# Patient Record
Sex: Male | Born: 1970 | Race: White | Hispanic: No | Marital: Single | Smoking: Current every day smoker
Health system: Southern US, Community
[De-identification: ages and names within clinical notes are randomized; demographics above are authoritative.]

---

## 2018-10-29 ENCOUNTER — Other Ambulatory Visit: Payer: Self-pay

## 2018-10-29 ENCOUNTER — Emergency Department: Payer: Self-pay

## 2018-10-29 ENCOUNTER — Emergency Department
Admission: EM | Admit: 2018-10-29 | Discharge: 2018-10-29 | Disposition: A | Discharge status: Home / Self Care | Payer: Self-pay | Attending: Student in an Organized Health Care Education/Training Program | Admitting: Student in an Organized Health Care Education/Training Program

## 2018-10-29 ENCOUNTER — Encounter: Payer: Self-pay | Admitting: Emergency Medicine

## 2018-10-29 DIAGNOSIS — M79602 Pain in left arm: Secondary | ICD-10-CM | POA: Insufficient documentation

## 2018-10-29 DIAGNOSIS — F17210 Nicotine dependence, cigarettes, uncomplicated: Secondary | ICD-10-CM | POA: Insufficient documentation

## 2018-10-29 DIAGNOSIS — R079 Chest pain, unspecified: Secondary | ICD-10-CM | POA: Insufficient documentation

## 2018-10-29 LAB — CBC WITH DIFFERENTIAL/PLATELET
Abs Immature Granulocytes: 0.1 10*3/uL — ABNORMAL HIGH (ref 0.00–0.07)
Basophils Absolute: 0 10*3/uL (ref 0.0–0.1)
Basophils Relative: 0 %
Eosinophils Absolute: 0.3 10*3/uL (ref 0.0–0.5)
Eosinophils Relative: 3 %
HEMATOCRIT: 46.9 % (ref 39.0–52.0)
HEMOGLOBIN: 15.8 g/dL (ref 13.0–17.0)
Immature Granulocytes: 1 %
LYMPHS ABS: 3.4 10*3/uL (ref 0.7–4.0)
Lymphocytes Relative: 33 %
MCH: 31.7 pg (ref 26.0–34.0)
MCHC: 33.7 g/dL (ref 30.0–36.0)
MCV: 94.2 fL (ref 80.0–100.0)
MONOS PCT: 9 %
Monocytes Absolute: 0.9 10*3/uL (ref 0.1–1.0)
Neutro Abs: 5.6 10*3/uL (ref 1.7–7.7)
Neutrophils Relative %: 54 %
Platelets: 248 10*3/uL (ref 150–400)
RBC: 4.98 MIL/uL (ref 4.22–5.81)
RDW: 13.2 % (ref 11.5–15.5)
WBC: 10.4 10*3/uL (ref 4.0–10.5)
nRBC: 0 % (ref 0.0–0.2)

## 2018-10-29 LAB — COMPREHENSIVE METABOLIC PANEL
ALT: 21 U/L (ref 0–44)
AST: 24 U/L (ref 15–41)
Albumin: 3.9 g/dL (ref 3.5–5.0)
Alkaline Phosphatase: 100 U/L (ref 38–126)
Anion gap: 7 (ref 5–15)
BILIRUBIN TOTAL: 0.6 mg/dL (ref 0.3–1.2)
BUN: 11 mg/dL (ref 6–20)
CHLORIDE: 107 mmol/L (ref 98–111)
CO2: 25 mmol/L (ref 22–32)
Calcium: 8.8 mg/dL — ABNORMAL LOW (ref 8.9–10.3)
Creatinine, Ser: 1.18 mg/dL (ref 0.61–1.24)
GFR calc Af Amer: >60 mL/min (ref >60)
GFR calc non Af Amer: >60 mL/min (ref >60)
Glucose, Bld: 92 mg/dL (ref 70–99)
Potassium: 4.1 mmol/L (ref 3.5–5.1)
Sodium: 139 mmol/L (ref 135–145)
Total Protein: 7.2 g/dL (ref 6.5–8.1)

## 2018-10-29 LAB — TROPONIN I
Troponin I: <0.03 ng/mL (ref <0.03)
Troponin I: <0.03 ng/mL (ref <0.03)

## 2018-10-29 MED ORDER — MORPHINE SULFATE (PF) 4 MG/ML IV SOLN
4.0000 mg | INTRAVENOUS | Status: DC | PRN
  Administered 2018-10-29: 4 mg via INTRAVENOUS
  Filled 2018-10-29: qty 1

## 2018-10-29 MED ORDER — HYDROCODONE-ACETAMINOPHEN 5-325 MG PO TABS: 1.0000 | ORAL_TABLET | ORAL | 0 refills | Status: AC | PRN

## 2018-10-29 MED ORDER — KETOROLAC TROMETHAMINE 30 MG/ML IJ SOLN
15.0000 mg | Freq: Once | INTRAMUSCULAR | Status: AC
  Administered 2018-10-29: 15 mg via INTRAVENOUS
  Filled 2018-10-29: qty 1

## 2018-10-29 MED ORDER — IOHEXOL 350 MG/ML SOLN
75.0000 mL | Freq: Once | INTRAVENOUS | Status: AC | PRN
  Administered 2018-10-29: 75 mL via INTRAVENOUS

## 2018-10-29 MED ORDER — ONDANSETRON HCL 4 MG/2ML IJ SOLN
4.0000 mg | Freq: Once | INTRAMUSCULAR | Status: AC
  Administered 2018-10-29: 4 mg via INTRAVENOUS
  Filled 2018-10-29: qty 2

## 2018-10-29 NOTE — ED Notes (Signed)
1 knife given to [employer] for placement in safe.

## 2018-10-29 NOTE — Discharge Instructions (Signed)
His follow-up with PCP.  Return to the ER if you develop any worsening pain or for any questions or concerns.

## 2018-10-29 NOTE — ED Provider Notes (Signed)
[facility] Emergency Department Provider Note    First MD Initiated Contact with Patient 10/29/18 0845     (approximate)  I have reviewed the triage vital signs and the nursing notes.   HISTORY  Chief Complaint Arm Pain and Pleurisy    HPI John Boyer is a 48 y.o. male presents the ER with several days of progressively worsening left wrist and arm pain.  States he does have a history of carpal tunnel but states the pain is gotten so severe that is rating up into his shoulder and into his chest.  Does not have any pain when taking deep inspiration.  States it felt better when he was sleeping on his left side.  Denies any trauma.  Denies any recent fevers.  Denies any history of heart disease.    History reviewed. No pertinent past medical history. No family history on file. History reviewed. No pertinent surgical history. There are no active problems to display for this patient.     Prior to Admission medications   Not on File    Allergies Patient has no known allergies.    Social History Social History   Tobacco Use  . Smoking status: Current Every Day Smoker    Packs/day: 1.50  . Smokeless tobacco: Never Used  Substance Use Topics  . Alcohol use: Yes    Comment: "weekend"  . Drug use: Yes    Frequency: 7.0 times per week    Types: Marijuana    Review of Systems Patient denies headaches, rhinorrhea, blurry vision, numbness, shortness of breath, chest pain, edema, cough, abdominal pain, nausea, vomiting, diarrhea, dysuria, fevers, rashes or hallucinations unless otherwise stated above in HPI. ____________________________________________   PHYSICAL EXAM:  VITAL SIGNS: Vitals:   10/29/18 0835  BP: 124/73  Pulse: 92  Resp: 18  Temp: 97.6 F (36.4 C)  SpO2: 99%    Constitutional: Alert and oriented.  Eyes: Conjunctivae are normal.  Head: Atraumatic. Nose: No congestion/rhinnorhea. Mouth/Throat: Mucous membranes are  moist.   Neck: No stridor. Painless ROM.  Cardiovascular: Normal rate, regular rhythm. Grossly normal heart sounds.  Good peripheral circulation.  Strong radial pulses bilaterally brisk cap refill.  No bruits auscultated Respiratory: Normal respiratory effort.  No retractions. Lungs CTAB. Gastrointestinal: Soft and nontender. No distention. No abdominal bruits. No CVA tenderness. Genitourinary:  Musculoskeletal: Pain reproduced with Tinel's sign and Phalen sign of the left.  No lower extremity tenderness nor edema.  No joint effusions. Neurologic:  Normal speech and language. No gross focal neurologic deficits are appreciated. No facial droop Skin:  Skin is warm, dry and intact. No rash noted. Psychiatric: Mood and affect are normal. Speech and behavior are normal.  ____________________________________________   LABS (all labs ordered are listed, but only abnormal results are displayed)  No results found for this or any previous visit (from the past 24 hour(s)). ____________________________________________  EKG My review and personal interpretation at Time: 8:31   Indication: shoulder pain  Rate: 90  Rhythm: sinus Axis: normal Other: normal intervals, no stemi ____________________________________________  RADIOLOGY  I personally reviewed all radiographic images ordered to evaluate for the above acute complaints and reviewed radiology reports and findings.  These findings were personally discussed with the patient.  Please see medical record for radiology report.  ____________________________________________   PROCEDURES  Procedure(s) performed:  Procedures    Critical Care performed: no ____________________________________________   INITIAL IMPRESSION / ASSESSMENT AND PLAN / ED COURSE  Pertinent labs & imaging results that  were available during my care of the patient were reviewed by me and considered in my medical decision making (see chart for details).   DDX: ACS,  pericarditis, esophagitis, boerhaaves, pe, dissection, pna, bronchitis, costochondritis   John Boyer is a 48 y.o. who presents to the ED with above symptoms presents the ER.  He is afebrile and hemodynamically stable.  He initial EKG shows no evidence of acute ischemia.  However given his age and presenting symptoms will further stratify with troponins.  Does not seem clinically consistent with dissection.  Patient is low risk by Wells criteria and is PERC negative.  Exam seems primarily musculoskeletal possible severe carpal tunnel.  No radicular symptoms.  Clinical Course as of Oct 29 1440  Wed Oct 29, 2018  1346 Repeat troponin negative.  CT angiogram is reassuring.   [PR]  1422 Patient's pain improved.  He is in no acute discomfort at this time.  He stable and appropriate for outpatient follow-up.   [PR]    Clinical Course User Index [PR] Willy Eddy, MD     As part of my medical decision making, I reviewed the following data within the electronic MEDICAL RECORD NUMBER Nursing notes reviewed and incorporated, Labs reviewed, notes from prior ED visits and [place] Controlled Substance Database   ____________________________________________   FINAL CLINICAL IMPRESSION(S) / ED DIAGNOSES  Final diagnoses:  Left arm pain      NEW MEDICATIONS STARTED DURING THIS VISIT:  New Prescriptions   No medications on file     Note:  This document was prepared using Dragon voice recognition software and may include unintentional dictation errors.    Willy Eddy, MD 10/29/18 [phone]

## 2018-10-29 NOTE — ED Triage Notes (Signed)
Pt presents to ED via POV with c/o L chest and L arm pain x 2 days. Pt states weakness to L arm, pain worse with movement, states pain radiates into L chest. Pt appears uncomfortable in triage.

## 2018-10-29 NOTE — ED Notes (Signed)
First Nurse Note: Patient states he thinks he has a "pinched nerve" no relief with ibuprofen.  Complaining of pain in left arm with numbness and tingling.

## 2019-12-10 IMAGING — CT CT ANGIO CHEST
3 of 6 series · 18 of 46 positions shown · IV contrast (APPLIED)
Comparison: Chest radiograph October 29, 2018

CLINICAL DATA: Chest pain

EXAM:
CT ANGIOGRAPHY CHEST WITH CONTRAST
TECHNIQUE: Multidetector CT imaging of the chest was performed using the
standard protocol during bolus administration of intravenous
contrast. Multiplanar CT image reconstructions and MIPs were
obtained to evaluate the vascular anatomy.
CONTRAST:  75mL OMNIPAQUE IOHEXOL 350 MG/ML SOLN

[Series 4: axial arterial · axial · arterial · 0.70mm/px · z∈[-231,+39]mm · 11 of 108 slices shown]
[im 9/108  lung]
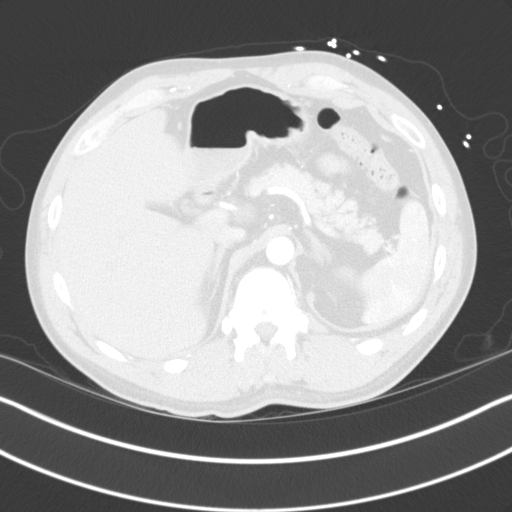
[im 18/108  soft-tissue]
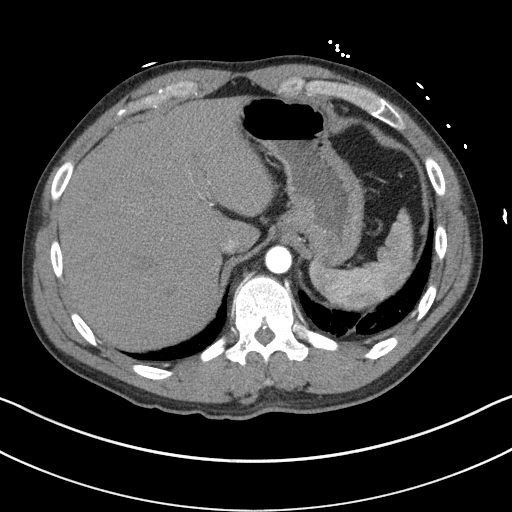
[im 27/108  lung]
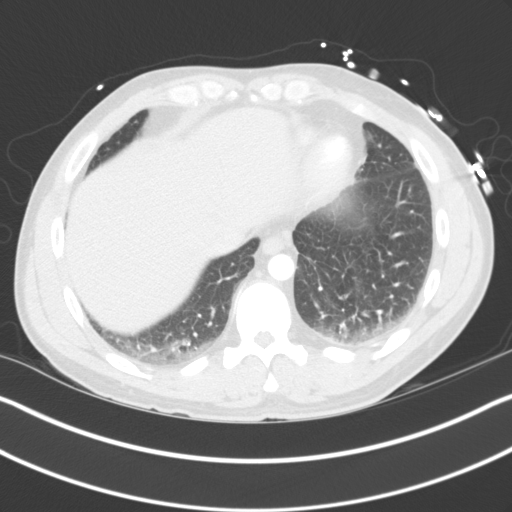
[im 36/108  soft-tissue]
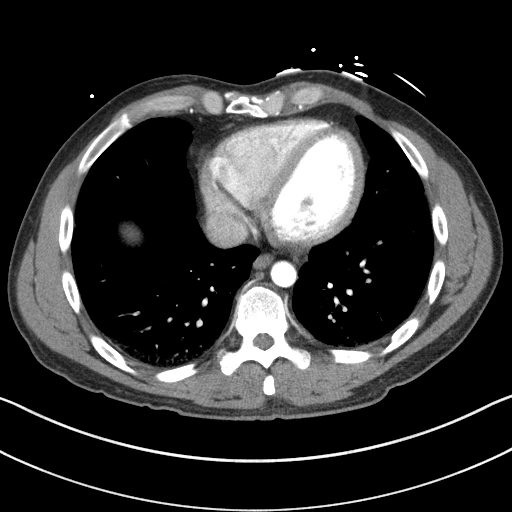
[im 45/108  lung]
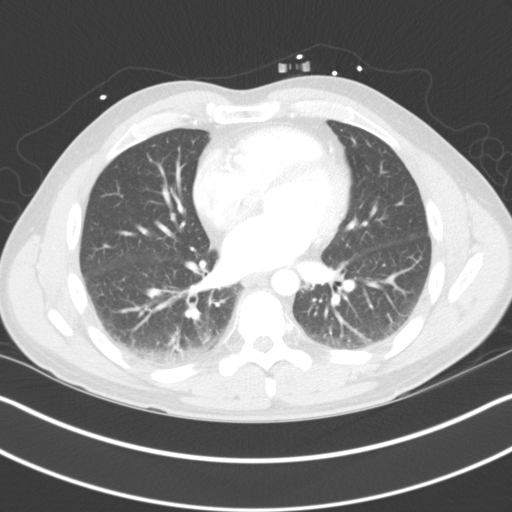
[im 54/108  soft-tissue]
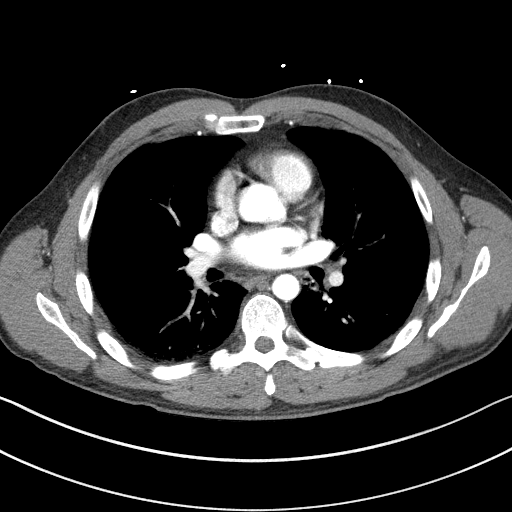
[im 63/108  lung]
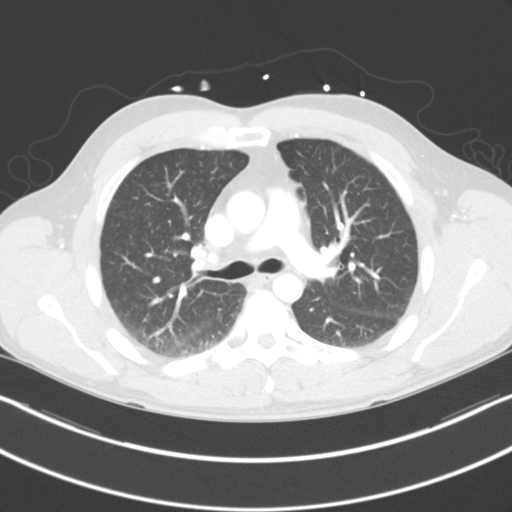
[im 72/108  soft-tissue]
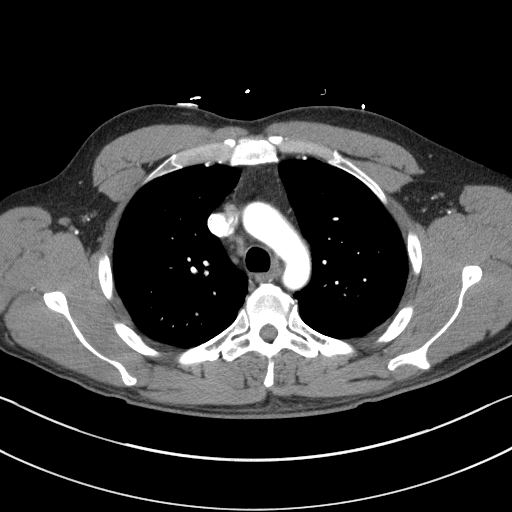
[im 81/108  lung]
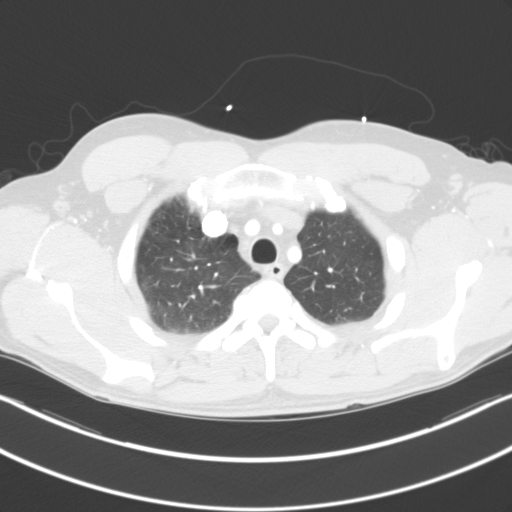
[im 90/108  soft-tissue]
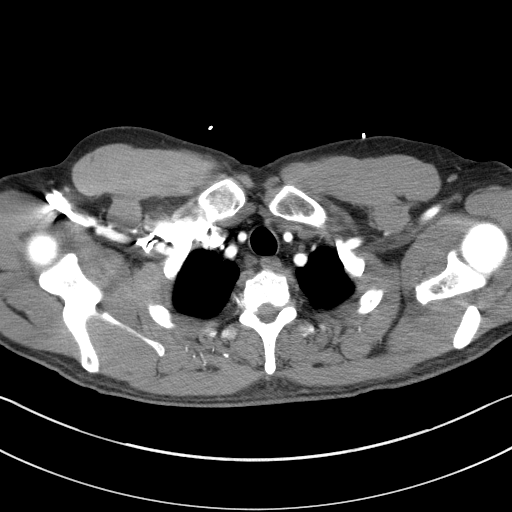
[im 99/108  lung]
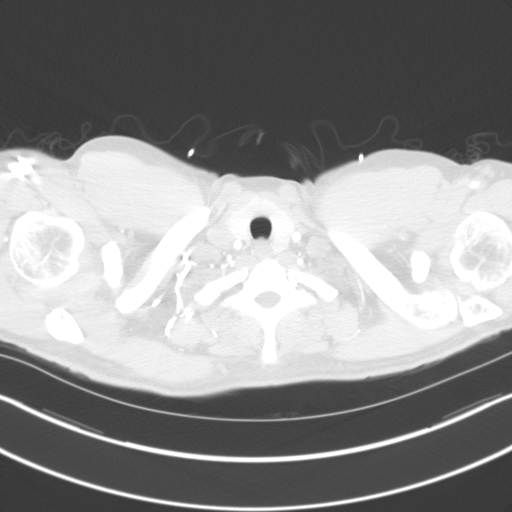

[Series 5: lung · axial · 0.70mm/px · z∈[-222,-114]mm · 4 of 162 slices shown]
[im 18/162  soft-tissue]
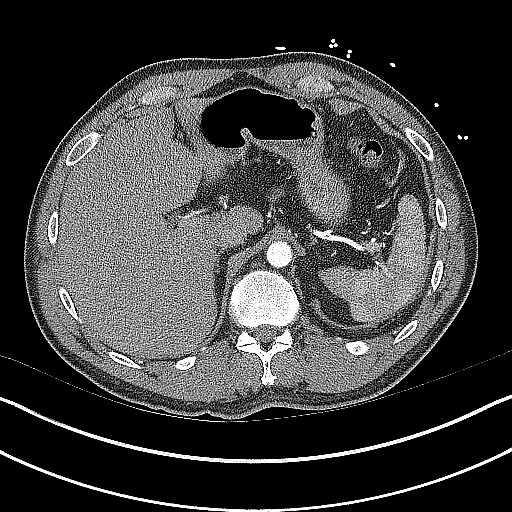
[im 36/162  soft-tissue]
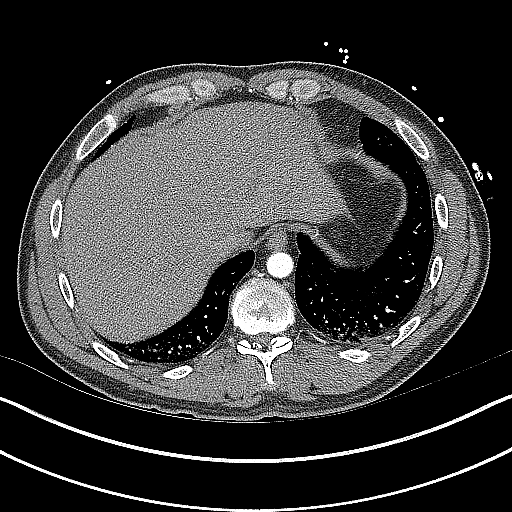
[im 54/162  soft-tissue]
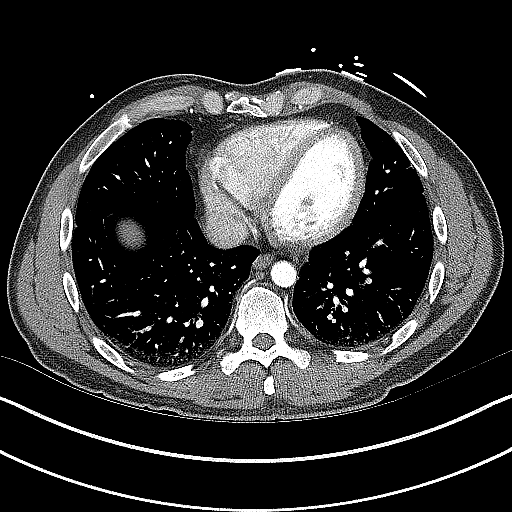
[im 72/162  soft-tissue]
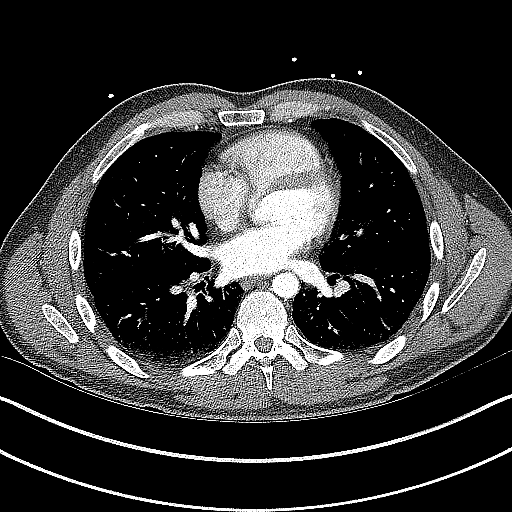

[Series 6: coronal · coronal · 0.65mm/px · 3 of 88 slices shown]
[im 22/88  soft-tissue]
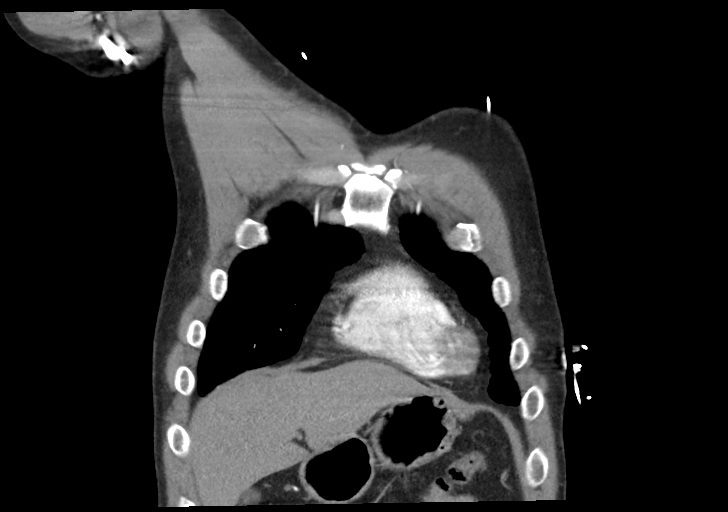
[im 44/88  soft-tissue]
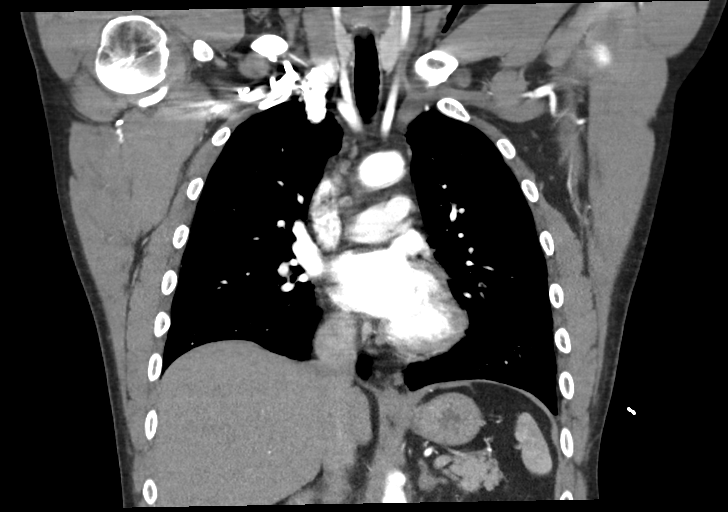
[im 66/88  soft-tissue]
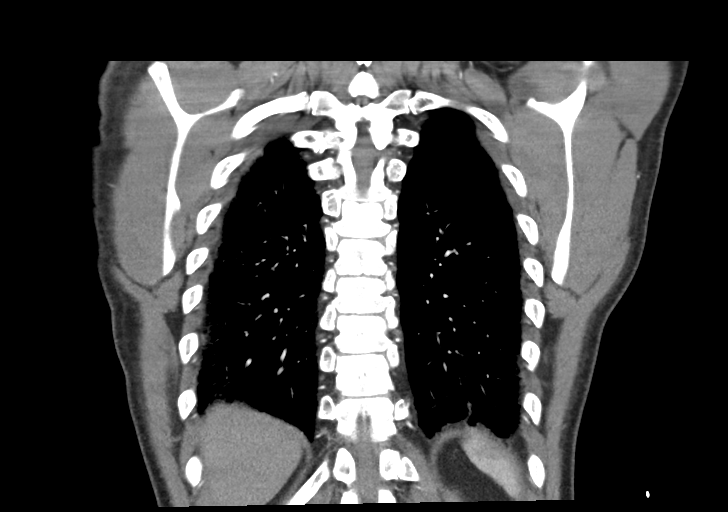

[18 of 46 positions shown; findings below may reference images not displayed]

FINDINGS: Cardiovascular: There is no thoracic aortic aneurysm or dissection.
The visualized great vessels appear normal. There is no appreciable
pulmonary embolus. There is no pericardial effusion or pericardial
thickening evident.

Mediastinum/Nodes: Thyroid appears normal. There are scattered
subcentimeter mediastinal lymph nodes. There is no lymph node
meeting size criteria for pathologic significance in the
mediastinum. No esophageal lesions are appreciable.

Lungs/Pleura: There is atelectatic change in the lower lobe regions
bilaterally. There is mild patchy opacity in the upper lobes, more
on the right, likely representing small airways obstruction. There
is no frank consolidation. There is slight lower lobe
bronchiectasis. No pleural effusions are evident.

Upper Abdomen: Visualized upper abdominal structures appear
unremarkable.

Musculoskeletal: There are no blastic or lytic bone lesions. No
chest wall lesions are evident.

Review of the MIP images confirms the above findings.
IMPRESSION: 1. No thoracic aortic aneurysm or dissection. No demonstrable
pulmonary embolus.

2. Atelectatic change in both lower lobes. Scattered foci of mosaic
attenuation, likely due to small airways obstructive disease. No
frank consolidation evident. There is slight lower lobe
bronchiectatic change bilaterally.

3. Subcentimeter mediastinal lymph nodes, not meeting size criteria
for adenopathy.
